# Patient Record
Sex: Female | Born: 1970 | Race: White | Hispanic: No | Marital: Married
Health system: Southern US, Community
[De-identification: ages and names within clinical notes are randomized; demographics above are authoritative.]

## PROBLEM LIST (undated history)

## (undated) HISTORY — PX: BREAST BIOPSY: SHX20

---

## 2012-09-10 ENCOUNTER — Other Ambulatory Visit: Payer: Self-pay

## 2012-09-10 DIAGNOSIS — Z1231 Encounter for screening mammogram for malignant neoplasm of breast: Secondary | ICD-10-CM

## 2012-09-29 ENCOUNTER — Ambulatory Visit
Admission: RE | Admit: 2012-09-29 | Discharge: 2012-09-29 | Disposition: A | Discharge status: Home / Self Care | Payer: BC Managed Care – PPO | Source: Ambulatory Visit

## 2012-09-29 DIAGNOSIS — Z1231 Encounter for screening mammogram for malignant neoplasm of breast: Secondary | ICD-10-CM

## 2012-10-02 ENCOUNTER — Other Ambulatory Visit: Payer: Self-pay | Admitting: Obstetrics and Gynecology

## 2012-10-02 DIAGNOSIS — R928 Other abnormal and inconclusive findings on diagnostic imaging of breast: Secondary | ICD-10-CM

## 2012-10-10 ENCOUNTER — Ambulatory Visit
Admission: RE | Admit: 2012-10-10 | Discharge: 2012-10-10 | Disposition: A | Discharge status: Home / Self Care | Payer: BC Managed Care – PPO | Source: Ambulatory Visit | Attending: Obstetrics and Gynecology | Admitting: Obstetrics and Gynecology

## 2012-10-10 DIAGNOSIS — R928 Other abnormal and inconclusive findings on diagnostic imaging of breast: Secondary | ICD-10-CM

## 2013-04-08 ENCOUNTER — Other Ambulatory Visit: Payer: Self-pay | Admitting: Obstetrics and Gynecology

## 2013-04-08 DIAGNOSIS — N6310 Unspecified lump in the right breast, unspecified quadrant: Secondary | ICD-10-CM

## 2013-04-13 ENCOUNTER — Ambulatory Visit
Admission: RE | Admit: 2013-04-13 | Discharge: 2013-04-13 | Disposition: A | Discharge status: Home / Self Care | Payer: BC Managed Care – PPO | Source: Ambulatory Visit | Attending: Obstetrics and Gynecology | Admitting: Obstetrics and Gynecology

## 2013-04-13 DIAGNOSIS — N6310 Unspecified lump in the right breast, unspecified quadrant: Secondary | ICD-10-CM

## 2013-08-31 ENCOUNTER — Other Ambulatory Visit: Payer: Self-pay | Admitting: Obstetrics and Gynecology

## 2013-08-31 DIAGNOSIS — N63 Unspecified lump in unspecified breast: Secondary | ICD-10-CM

## 2013-10-01 ENCOUNTER — Ambulatory Visit
Admission: RE | Admit: 2013-10-01 | Discharge: 2013-10-01 | Disposition: A | Discharge status: Home / Self Care | Payer: BC Managed Care – PPO | Source: Ambulatory Visit | Attending: Obstetrics and Gynecology | Admitting: Obstetrics and Gynecology

## 2013-10-01 ENCOUNTER — Encounter (INDEPENDENT_AMBULATORY_CARE_PROVIDER_SITE_OTHER): Payer: Self-pay

## 2013-10-01 DIAGNOSIS — N63 Unspecified lump in unspecified breast: Secondary | ICD-10-CM

## 2014-09-14 ENCOUNTER — Other Ambulatory Visit: Payer: Self-pay | Admitting: Obstetrics and Gynecology

## 2014-09-14 DIAGNOSIS — N63 Unspecified lump in unspecified breast: Secondary | ICD-10-CM

## 2014-10-04 ENCOUNTER — Other Ambulatory Visit: Payer: Self-pay | Admitting: Obstetrics and Gynecology

## 2014-10-04 ENCOUNTER — Ambulatory Visit
Admission: RE | Admit: 2014-10-04 | Discharge: 2014-10-04 | Disposition: A | Discharge status: Home / Self Care | Payer: 59 | Source: Ambulatory Visit | Attending: Obstetrics and Gynecology | Admitting: Obstetrics and Gynecology

## 2014-10-04 DIAGNOSIS — N63 Unspecified lump in unspecified breast: Secondary | ICD-10-CM

## 2015-10-07 ENCOUNTER — Other Ambulatory Visit: Payer: Self-pay | Admitting: Obstetrics and Gynecology

## 2015-10-07 DIAGNOSIS — Z1231 Encounter for screening mammogram for malignant neoplasm of breast: Secondary | ICD-10-CM

## 2015-10-20 ENCOUNTER — Ambulatory Visit
Admission: RE | Admit: 2015-10-20 | Discharge: 2015-10-20 | Disposition: A | Discharge status: Home / Self Care | Payer: 59 | Source: Ambulatory Visit | Attending: Obstetrics and Gynecology | Admitting: Obstetrics and Gynecology

## 2015-10-20 DIAGNOSIS — Z1231 Encounter for screening mammogram for malignant neoplasm of breast: Secondary | ICD-10-CM

## 2016-10-17 ENCOUNTER — Other Ambulatory Visit: Payer: Self-pay | Admitting: Obstetrics and Gynecology

## 2016-10-17 DIAGNOSIS — Z1231 Encounter for screening mammogram for malignant neoplasm of breast: Secondary | ICD-10-CM

## 2016-10-26 ENCOUNTER — Ambulatory Visit
Admission: RE | Admit: 2016-10-26 | Discharge: 2016-10-26 | Disposition: A | Discharge status: Home / Self Care | Payer: 59 | Source: Ambulatory Visit | Attending: Obstetrics and Gynecology | Admitting: Obstetrics and Gynecology

## 2016-10-26 DIAGNOSIS — Z1231 Encounter for screening mammogram for malignant neoplasm of breast: Secondary | ICD-10-CM

## 2017-10-25 ENCOUNTER — Other Ambulatory Visit: Payer: Self-pay | Admitting: Obstetrics and Gynecology

## 2017-10-25 DIAGNOSIS — Z1231 Encounter for screening mammogram for malignant neoplasm of breast: Secondary | ICD-10-CM

## 2017-11-27 ENCOUNTER — Ambulatory Visit: Payer: Self-pay

## 2017-11-29 ENCOUNTER — Ambulatory Visit: Payer: Self-pay

## 2017-12-19 ENCOUNTER — Ambulatory Visit
Admission: RE | Admit: 2017-12-19 | Discharge: 2017-12-19 | Disposition: A | Discharge status: Home / Self Care | Payer: Commercial Managed Care - PPO | Source: Ambulatory Visit | Attending: Obstetrics and Gynecology | Admitting: Obstetrics and Gynecology

## 2017-12-19 DIAGNOSIS — Z1231 Encounter for screening mammogram for malignant neoplasm of breast: Secondary | ICD-10-CM

## 2018-11-11 ENCOUNTER — Other Ambulatory Visit: Payer: Self-pay | Admitting: Obstetrics and Gynecology

## 2018-11-11 DIAGNOSIS — Z1231 Encounter for screening mammogram for malignant neoplasm of breast: Secondary | ICD-10-CM

## 2018-12-24 ENCOUNTER — Other Ambulatory Visit: Payer: Self-pay

## 2018-12-24 ENCOUNTER — Ambulatory Visit
Admission: RE | Admit: 2018-12-24 | Discharge: 2018-12-24 | Disposition: A | Discharge status: Home / Self Care | Payer: Commercial Managed Care - PPO | Source: Ambulatory Visit | Attending: Obstetrics and Gynecology | Admitting: Obstetrics and Gynecology

## 2018-12-24 DIAGNOSIS — Z1231 Encounter for screening mammogram for malignant neoplasm of breast: Secondary | ICD-10-CM

## 2018-12-25 ENCOUNTER — Other Ambulatory Visit: Payer: Self-pay | Admitting: Obstetrics and Gynecology

## 2018-12-25 DIAGNOSIS — R928 Other abnormal and inconclusive findings on diagnostic imaging of breast: Secondary | ICD-10-CM

## 2018-12-26 ENCOUNTER — Ambulatory Visit: Payer: Commercial Managed Care - PPO

## 2018-12-26 ENCOUNTER — Ambulatory Visit
Admission: RE | Admit: 2018-12-26 | Discharge: 2018-12-26 | Disposition: A | Discharge status: Home / Self Care | Payer: Commercial Managed Care - PPO | Source: Ambulatory Visit | Attending: Obstetrics and Gynecology | Admitting: Obstetrics and Gynecology

## 2018-12-26 ENCOUNTER — Other Ambulatory Visit: Payer: Self-pay

## 2018-12-26 DIAGNOSIS — R928 Other abnormal and inconclusive findings on diagnostic imaging of breast: Secondary | ICD-10-CM

## 2018-12-29 ENCOUNTER — Other Ambulatory Visit: Payer: Commercial Managed Care - PPO

## 2018-12-31 ENCOUNTER — Other Ambulatory Visit: Payer: Self-pay | Admitting: Obstetrics and Gynecology

## 2019-11-17 ENCOUNTER — Other Ambulatory Visit: Payer: Self-pay | Admitting: Obstetrics and Gynecology

## 2019-11-17 DIAGNOSIS — Z1231 Encounter for screening mammogram for malignant neoplasm of breast: Secondary | ICD-10-CM

## 2019-12-31 ENCOUNTER — Ambulatory Visit
Admission: RE | Admit: 2019-12-31 | Discharge: 2019-12-31 | Disposition: A | Discharge status: Home / Self Care | Payer: Commercial Managed Care - PPO | Source: Ambulatory Visit | Attending: Obstetrics and Gynecology | Admitting: Obstetrics and Gynecology

## 2019-12-31 ENCOUNTER — Other Ambulatory Visit: Payer: Self-pay

## 2019-12-31 DIAGNOSIS — Z1231 Encounter for screening mammogram for malignant neoplasm of breast: Secondary | ICD-10-CM

## 2020-09-12 IMAGING — MG MM DIGITAL DIAGNOSTIC UNILAT*L* W/ TOMO
4 series · 4 of 12 positions shown · non-contrast
Comparison: December 24, 2018 and earlier priors

CLINICAL DATA: 48-year-old patient recalled from recent screening
mammogram for evaluation of possible architectural distortion in the
left breast identified only in the CC projection. No history of
breast surgery.

EXAM:
DIGITAL DIAGNOSTIC UNILATERAL LEFT MAMMOGRAM WITH CAD AND TOMO

[L CC synth-2D]
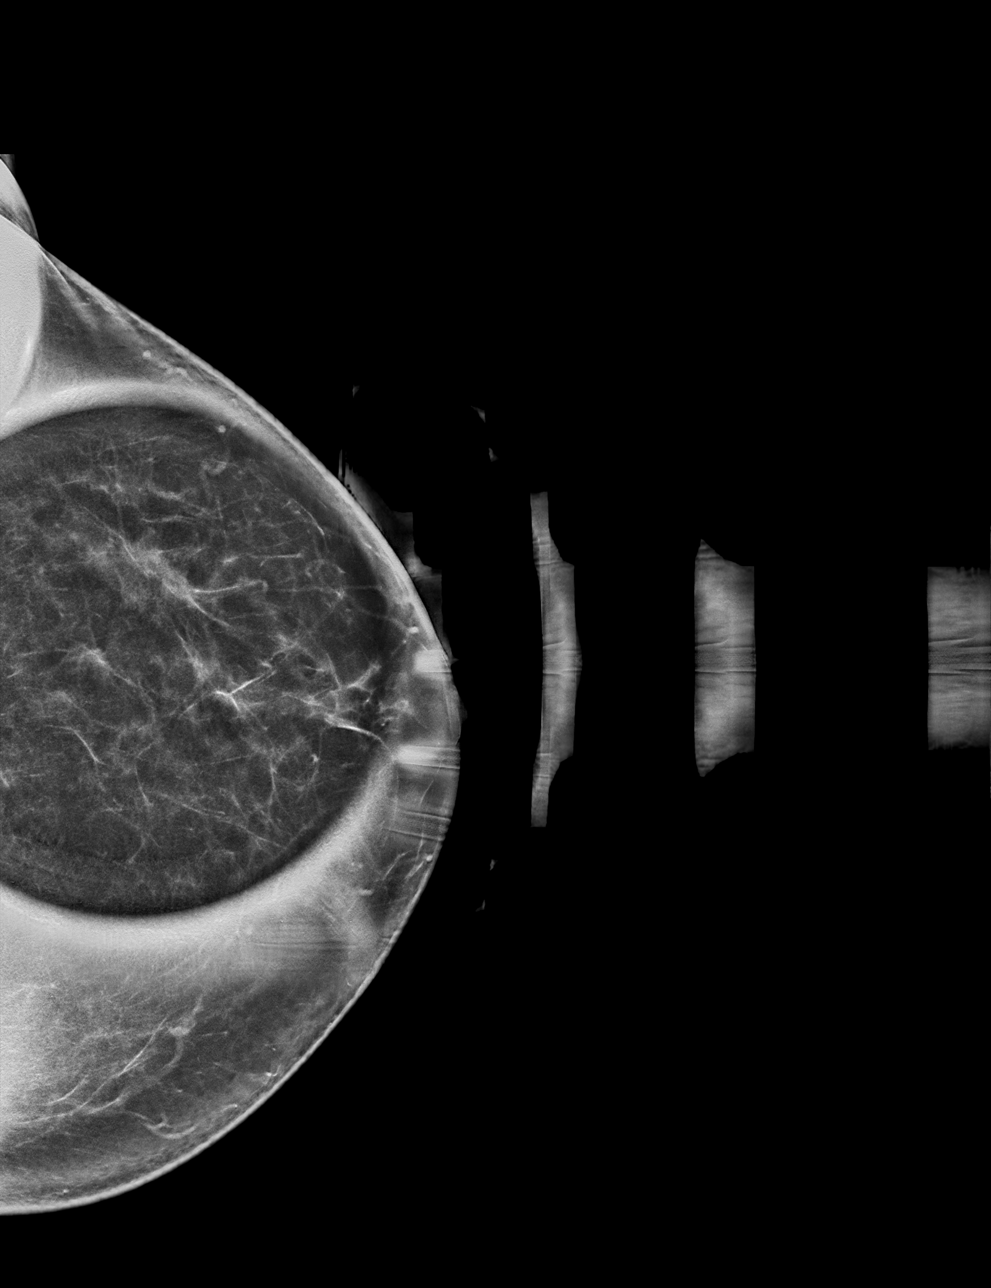

[L ML synth-2D]
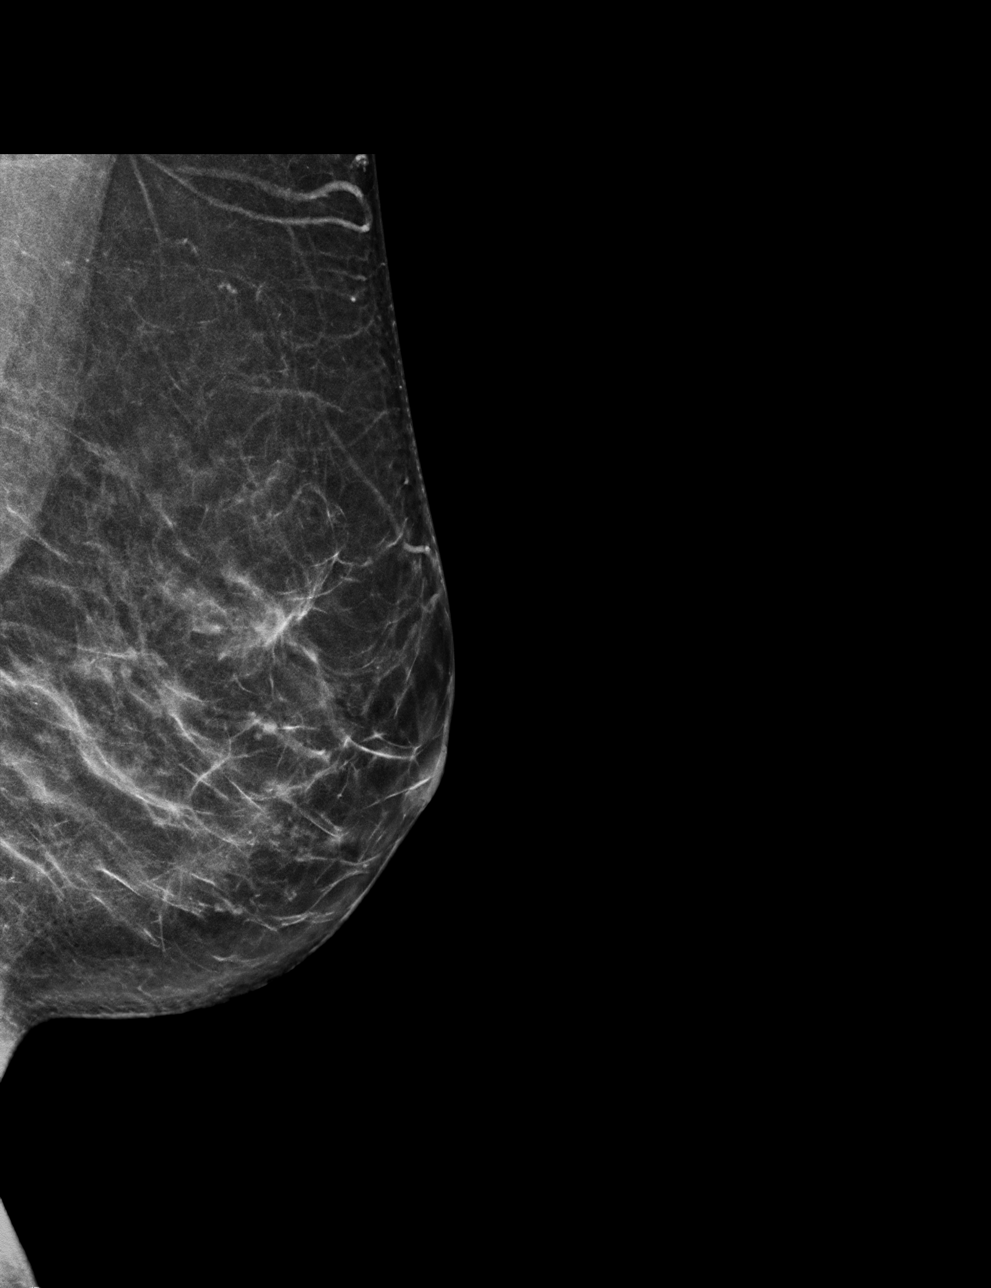

[L ML tomo · tomo slice 31/62.0]
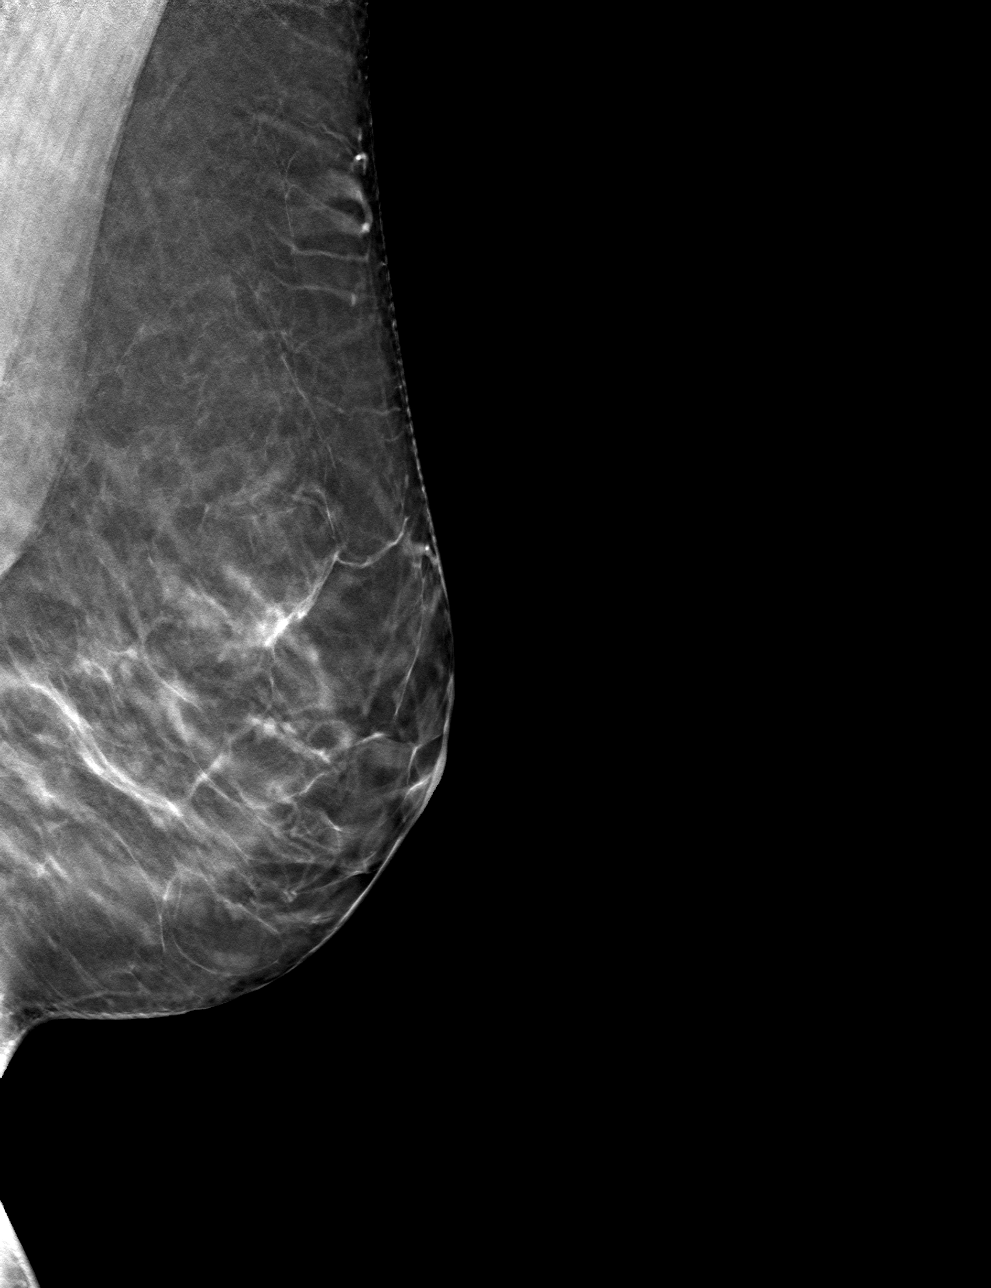

[L CC tomo · tomo slice 33/65.0]
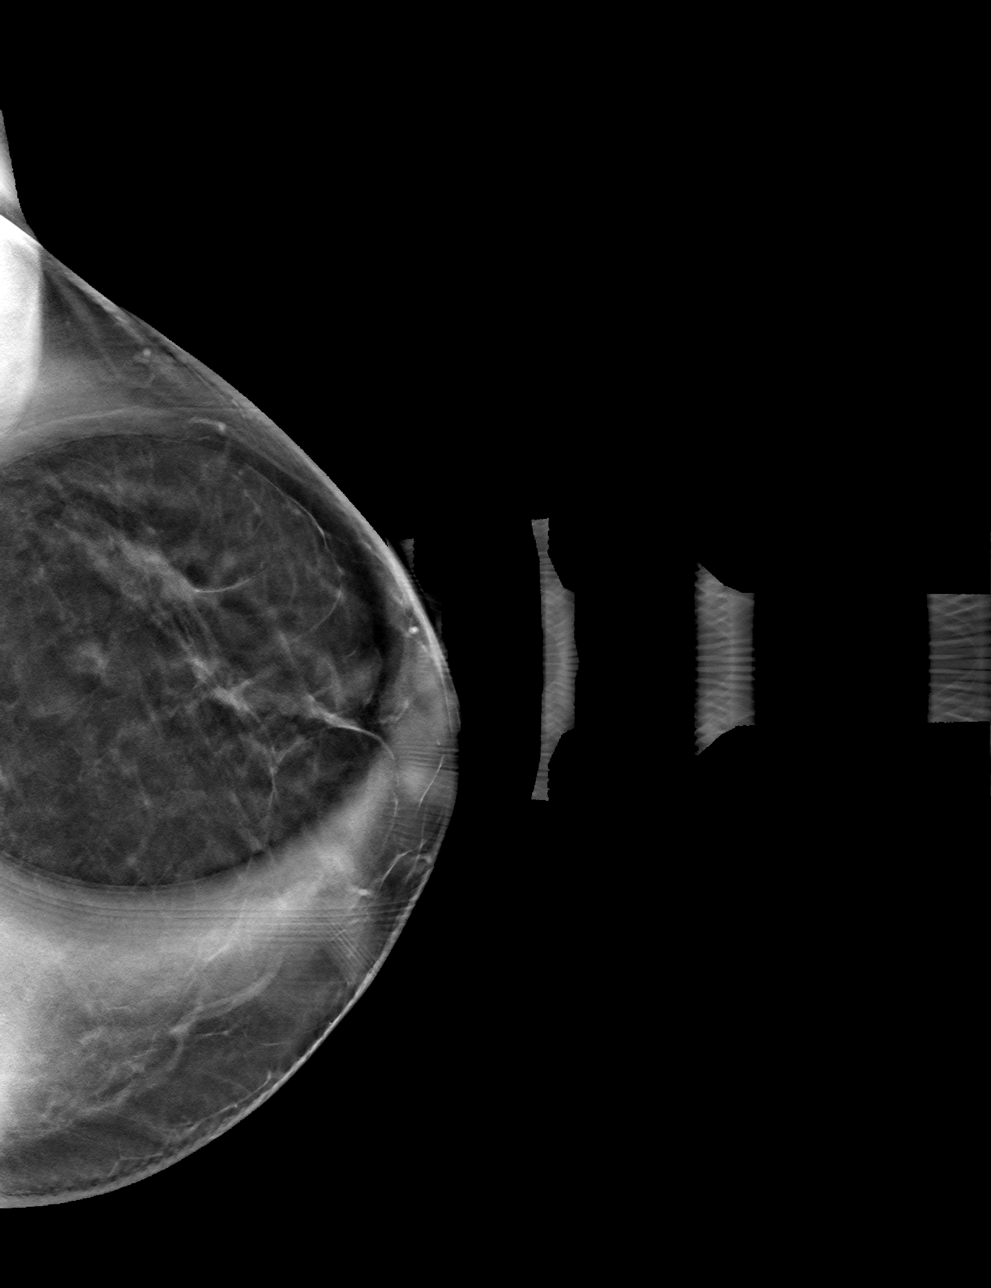

[4 of 12 positions shown; findings below may reference images not displayed]

ACR Breast Density Category b: There are scattered areas of
fibroglandular density.
FINDINGS: Spot compression view of the outer left breast with tomography shows
a stable parenchymal pattern. There is no architectural distortion
or mass. 90 degree lateral view of the left breast is negative.

Mammographic images were processed with CAD.
IMPRESSION: No evidence of malignancy in the left breast.

RECOMMENDATION:
Screening mammogram in one year.(Code:[id number])

I have discussed the findings and recommendations with the patient.
If applicable, a reminder letter will be sent to the patient
regarding the next appointment.

BI-RADS CATEGORY  1: Negative.

## 2020-11-28 ENCOUNTER — Other Ambulatory Visit: Payer: Self-pay | Admitting: Obstetrics and Gynecology

## 2020-11-28 DIAGNOSIS — Z1231 Encounter for screening mammogram for malignant neoplasm of breast: Secondary | ICD-10-CM

## 2021-01-02 ENCOUNTER — Ambulatory Visit
Admission: RE | Admit: 2021-01-02 | Discharge: 2021-01-02 | Disposition: A | Discharge status: Home / Self Care | Payer: Commercial Managed Care - PPO | Source: Ambulatory Visit | Attending: Obstetrics and Gynecology | Admitting: Obstetrics and Gynecology

## 2021-01-02 ENCOUNTER — Other Ambulatory Visit: Payer: Self-pay

## 2021-01-02 DIAGNOSIS — Z1231 Encounter for screening mammogram for malignant neoplasm of breast: Secondary | ICD-10-CM

## 2021-09-17 IMAGING — MG DIGITAL SCREENING BILAT W/ TOMO W/ CAD
8 series · 9 of 24 positions shown · non-contrast
Comparison: Previous exam(s).

CLINICAL DATA: Screening.

EXAM:
DIGITAL SCREENING BILATERAL MAMMOGRAM WITH TOMO AND CAD

[L MLO synth-2D]
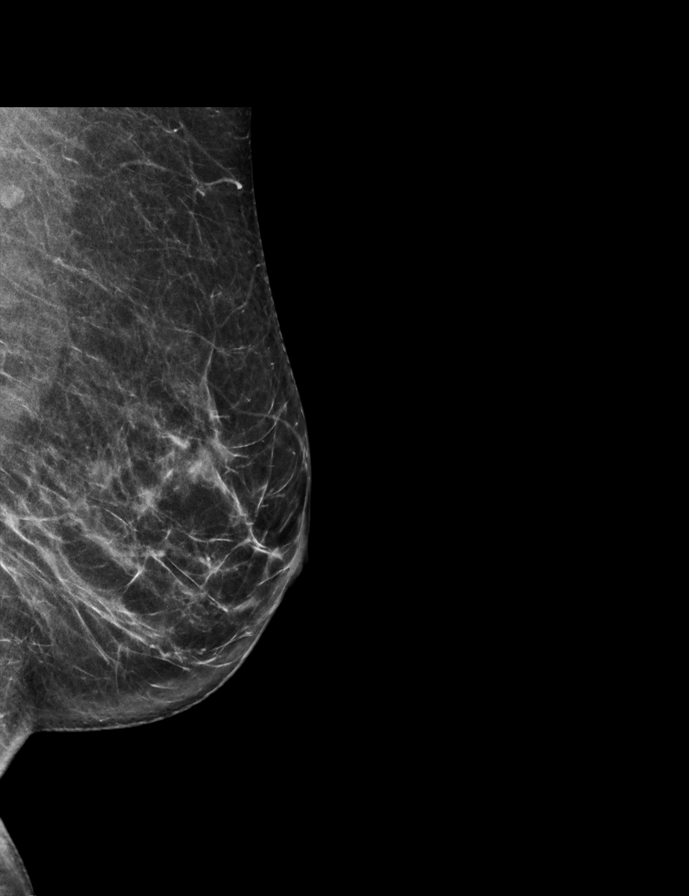

[L CC synth-2D]
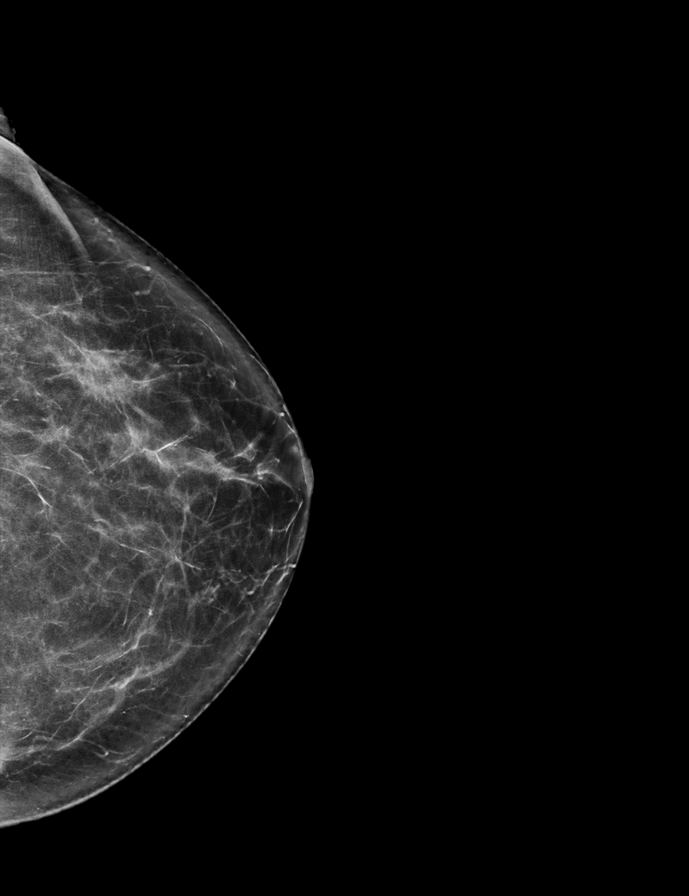

[R MLO synth-2D]
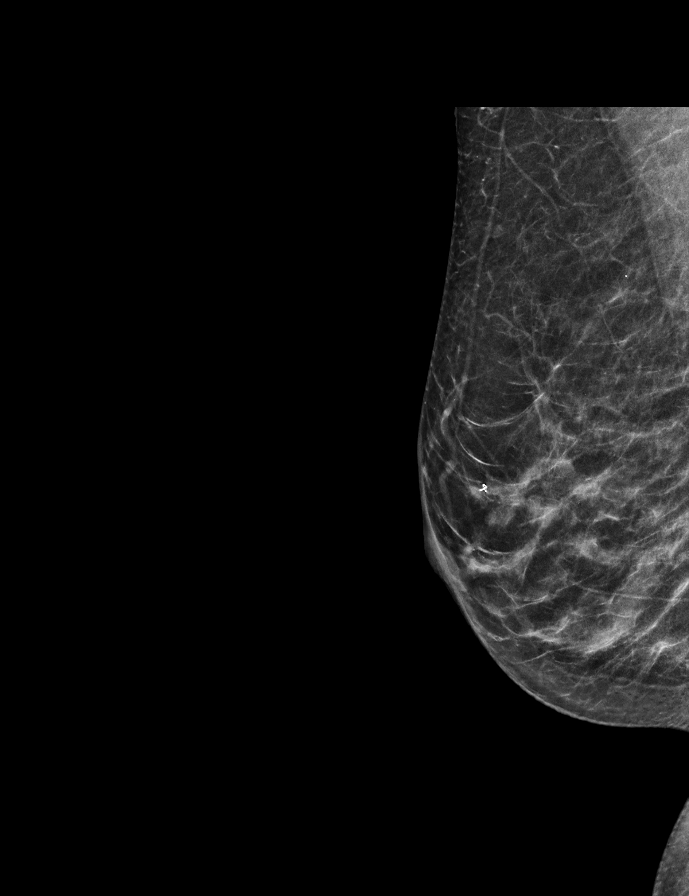

[R CC synth-2D]
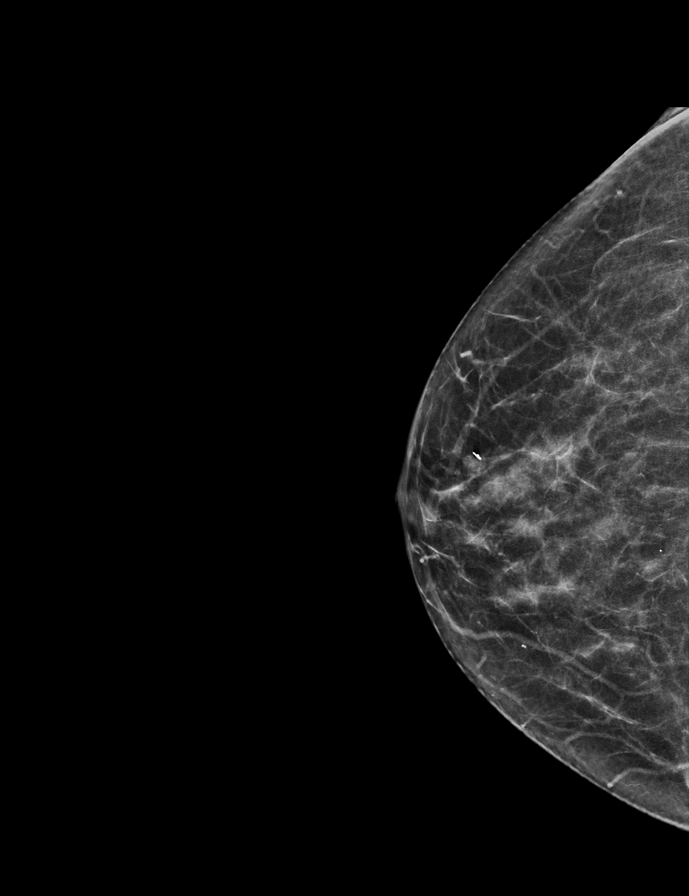

[R CC tomo · 2 of 52 frames shown]
[frame 17/52]
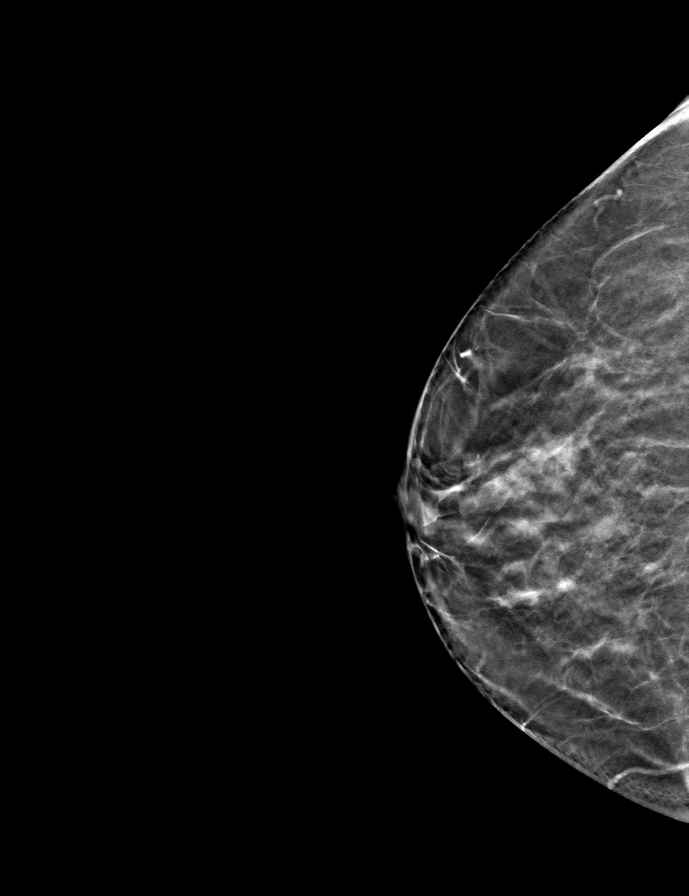
[frame 27/52]
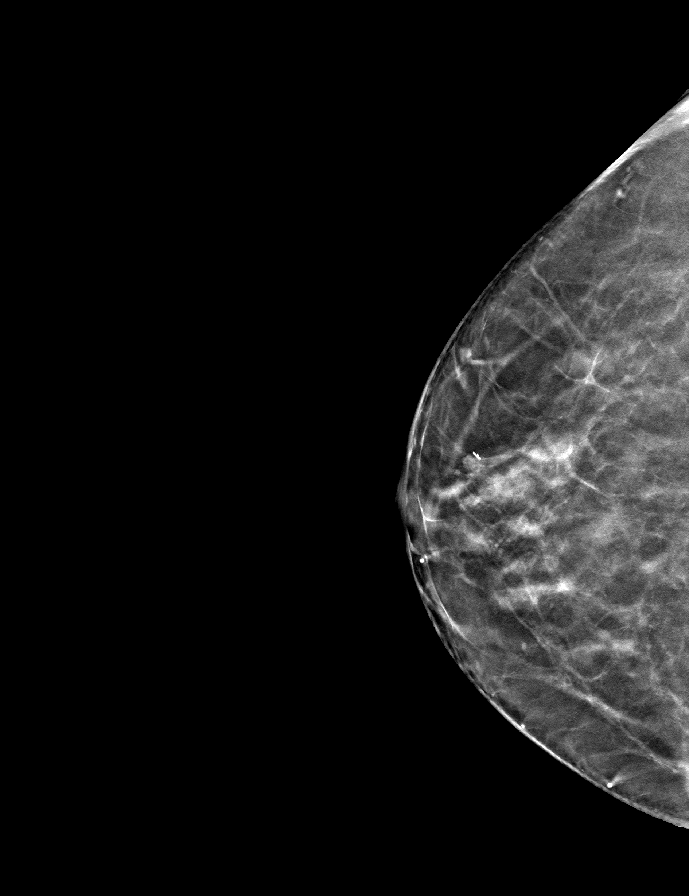

[L CC tomo · tomo slice 33/64.0]
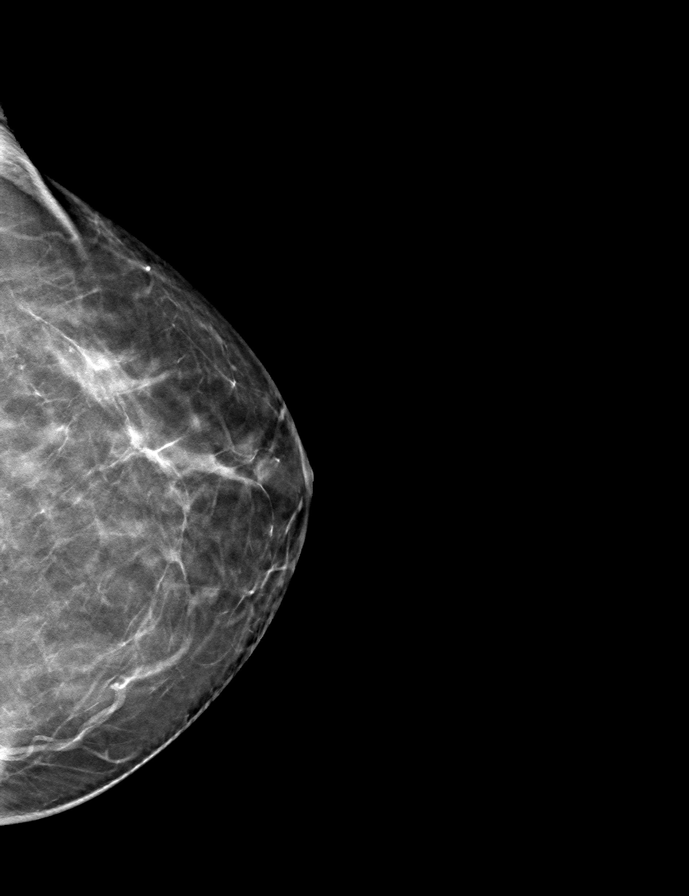

[L MLO tomo · tomo slice 31/60.0]
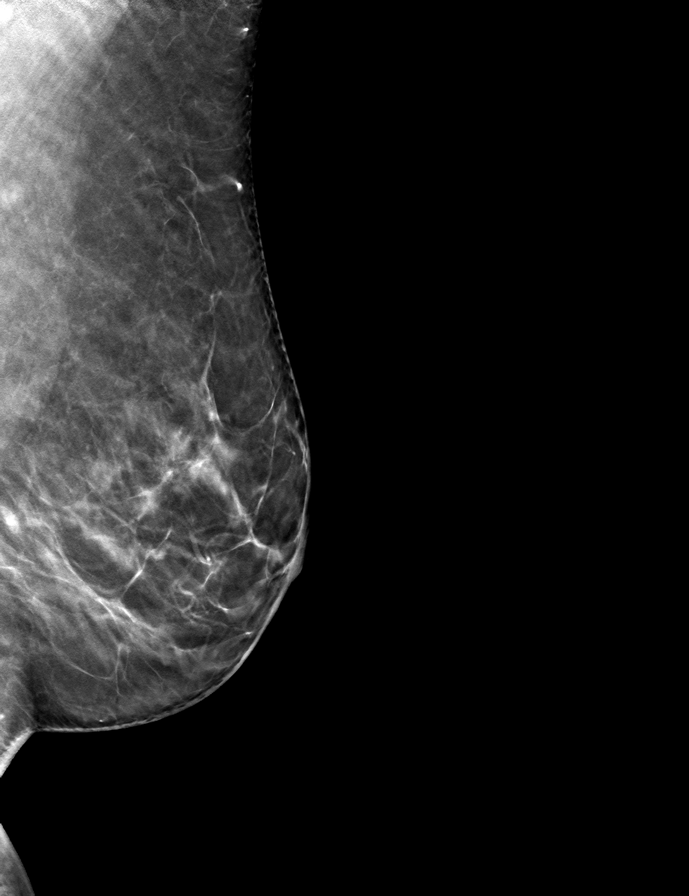

[R MLO tomo · tomo slice 27/54.0]
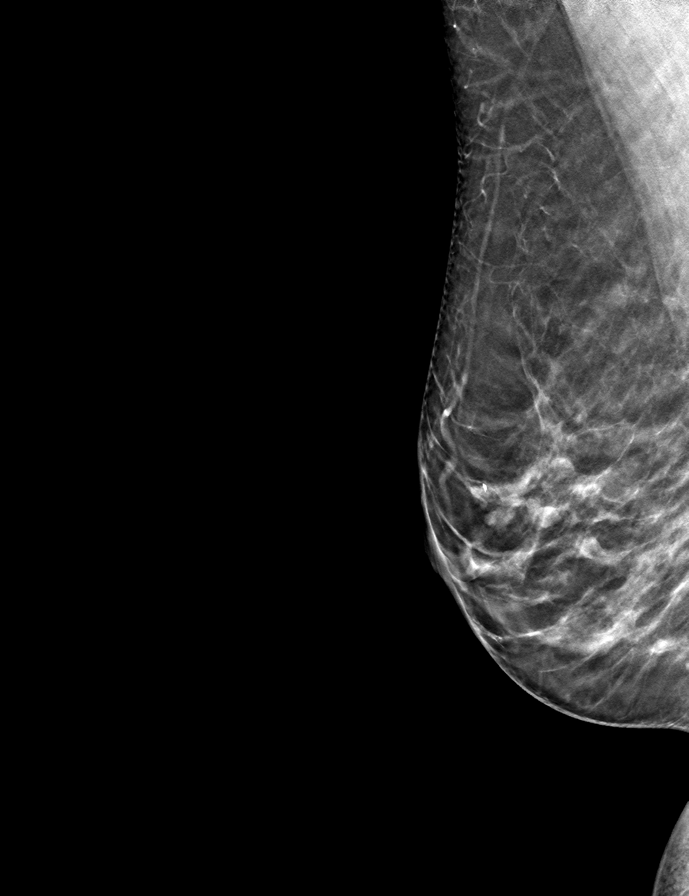

[9 of 24 positions shown; findings below may reference images not displayed]

ACR Breast Density Category b: There are scattered areas of
fibroglandular density.
FINDINGS: There are no findings suspicious for malignancy. Images were
processed with CAD.
IMPRESSION: No mammographic evidence of malignancy. A result letter of this
screening mammogram will be mailed directly to the patient.

RECOMMENDATION:
Screening mammogram in one year. (Code:[id number])

BI-RADS CATEGORY  1: Negative.

## 2021-11-30 ENCOUNTER — Other Ambulatory Visit: Payer: Self-pay | Admitting: Obstetrics and Gynecology

## 2021-11-30 DIAGNOSIS — Z1231 Encounter for screening mammogram for malignant neoplasm of breast: Secondary | ICD-10-CM

## 2022-01-03 ENCOUNTER — Ambulatory Visit
Admission: RE | Admit: 2022-01-03 | Discharge: 2022-01-03 | Disposition: A | Discharge status: Home / Self Care | Payer: 59 | Source: Ambulatory Visit | Attending: Obstetrics and Gynecology | Admitting: Obstetrics and Gynecology

## 2022-01-03 DIAGNOSIS — Z1231 Encounter for screening mammogram for malignant neoplasm of breast: Secondary | ICD-10-CM

## 2022-01-05 ENCOUNTER — Other Ambulatory Visit: Payer: Self-pay | Admitting: Obstetrics and Gynecology

## 2022-01-05 DIAGNOSIS — R928 Other abnormal and inconclusive findings on diagnostic imaging of breast: Secondary | ICD-10-CM

## 2022-01-17 ENCOUNTER — Ambulatory Visit
Admission: RE | Admit: 2022-01-17 | Discharge: 2022-01-17 | Disposition: A | Discharge status: Home / Self Care | Payer: 59 | Source: Ambulatory Visit | Attending: Obstetrics and Gynecology | Admitting: Obstetrics and Gynecology

## 2022-01-17 DIAGNOSIS — R928 Other abnormal and inconclusive findings on diagnostic imaging of breast: Secondary | ICD-10-CM

## 2022-12-13 ENCOUNTER — Other Ambulatory Visit: Payer: Self-pay | Admitting: Obstetrics and Gynecology

## 2022-12-13 DIAGNOSIS — Z1231 Encounter for screening mammogram for malignant neoplasm of breast: Secondary | ICD-10-CM

## 2023-01-18 ENCOUNTER — Ambulatory Visit
Admission: RE | Admit: 2023-01-18 | Discharge: 2023-01-18 | Disposition: A | Discharge status: Home / Self Care | Payer: 59 | Source: Ambulatory Visit | Attending: Obstetrics and Gynecology

## 2023-01-18 DIAGNOSIS — Z1231 Encounter for screening mammogram for malignant neoplasm of breast: Secondary | ICD-10-CM

## 2024-01-13 ENCOUNTER — Other Ambulatory Visit: Payer: Self-pay | Admitting: Obstetrics and Gynecology

## 2024-01-13 DIAGNOSIS — Z1231 Encounter for screening mammogram for malignant neoplasm of breast: Secondary | ICD-10-CM

## 2024-02-14 ENCOUNTER — Ambulatory Visit

## 2024-02-14 ENCOUNTER — Ambulatory Visit
Admission: RE | Admit: 2024-02-14 | Discharge: 2024-02-14 | Disposition: A | Source: Ambulatory Visit | Attending: Obstetrics and Gynecology | Admitting: Obstetrics and Gynecology

## 2024-02-14 DIAGNOSIS — Z1231 Encounter for screening mammogram for malignant neoplasm of breast: Secondary | ICD-10-CM
